# Patient Record
Sex: Male | Born: 1990 | Race: White | Hispanic: No | Marital: Single | Smoking: Never smoker
Health system: Southern US, Community
[De-identification: ages and names within clinical notes are randomized; demographics above are authoritative.]

## PROBLEM LIST (undated history)

## (undated) HISTORY — PX: ABDOMINAL SURGERY: SHX537

## (undated) HISTORY — PX: APPENDECTOMY: SHX54

---

## 2014-01-30 ENCOUNTER — Other Ambulatory Visit: Payer: Self-pay | Admitting: Family Medicine

## 2014-01-30 DIAGNOSIS — R1032 Left lower quadrant pain: Secondary | ICD-10-CM

## 2014-01-30 DIAGNOSIS — K469 Unspecified abdominal hernia without obstruction or gangrene: Secondary | ICD-10-CM

## 2014-02-02 ENCOUNTER — Ambulatory Visit
Admission: RE | Admit: 2014-02-02 | Discharge: 2014-02-02 | Disposition: A | Discharge status: Home / Self Care | Payer: BC Managed Care – PPO | Source: Ambulatory Visit | Attending: Family Medicine | Admitting: Family Medicine

## 2014-02-02 DIAGNOSIS — R1032 Left lower quadrant pain: Secondary | ICD-10-CM

## 2014-02-02 DIAGNOSIS — K469 Unspecified abdominal hernia without obstruction or gangrene: Secondary | ICD-10-CM

## 2014-02-02 MED ORDER — METHYLPREDNISOLONE ACETATE 40 MG/ML INJ SUSP (RADIOLOG
120.0000 mg | Freq: Once | INTRAMUSCULAR | Status: AC
  Administered 2014-02-02: 80 mg via INTRA_ARTICULAR

## 2014-02-02 MED ORDER — IOHEXOL 180 MG/ML  SOLN: 1.0000 mL | Freq: Once | INTRAMUSCULAR | Status: AC | PRN

## 2015-09-19 ENCOUNTER — Emergency Department (HOSPITAL_COMMUNITY)
Admission: EM | Admit: 2015-09-19 | Discharge: 2015-09-20 | Disposition: A | Discharge status: Home / Self Care | Payer: Managed Care, Other (non HMO) | Attending: Emergency Medicine | Admitting: Emergency Medicine

## 2015-09-19 ENCOUNTER — Emergency Department (HOSPITAL_COMMUNITY): Payer: Managed Care, Other (non HMO)

## 2015-09-19 ENCOUNTER — Encounter (HOSPITAL_COMMUNITY): Payer: Self-pay | Admitting: Emergency Medicine

## 2015-09-19 DIAGNOSIS — Y9366 Activity, soccer: Secondary | ICD-10-CM | POA: Insufficient documentation

## 2015-09-19 DIAGNOSIS — S022XXA Fracture of nasal bones, initial encounter for closed fracture: Secondary | ICD-10-CM | POA: Insufficient documentation

## 2015-09-19 DIAGNOSIS — S0121XA Laceration without foreign body of nose, initial encounter: Secondary | ICD-10-CM | POA: Diagnosis not present

## 2015-09-19 DIAGNOSIS — IMO0002 Reserved for concepts with insufficient information to code with codable children: Secondary | ICD-10-CM

## 2015-09-19 DIAGNOSIS — Y999 Unspecified external cause status: Secondary | ICD-10-CM | POA: Diagnosis not present

## 2015-09-19 DIAGNOSIS — S0992XA Unspecified injury of nose, initial encounter: Secondary | ICD-10-CM | POA: Diagnosis present

## 2015-09-19 DIAGNOSIS — W228XXA Striking against or struck by other objects, initial encounter: Secondary | ICD-10-CM | POA: Insufficient documentation

## 2015-09-19 DIAGNOSIS — Y92322 Soccer field as the place of occurrence of the external cause: Secondary | ICD-10-CM | POA: Insufficient documentation

## 2015-09-19 MED ORDER — LIDOCAINE HCL 2 % IJ SOLN
20.0000 mL | Freq: Once | INTRAMUSCULAR | Status: AC
  Administered 2015-09-19: 400 mg
  Filled 2015-09-19: qty 20

## 2015-09-19 NOTE — ED Notes (Signed)
Pt. presents with laceration at bridge of nose approx.1" sustained this evening accidentally hit while playing soccer . Denies LOC/no epistaxis .

## 2015-09-19 NOTE — ED Provider Notes (Signed)
CSN: [id number]     Arrival date & time 09/19/15  2045 History  By signing my name below, I, Phillis HaggisGabriella Gaje, attest that this documentation has been prepared under the direction and in the presence of Newell RubbermaidJeffrey Youssef Footman, PA-C. Electronically Signed: Phillis HaggisGabriella Gaje, ED Scribe. 09/19/2015. 11:31 PM.   Chief Complaint  Patient presents with  . Laceration   The history is provided by the patient. No language interpreter was used.  HPI Comments: Steven ReamsJusten Lester is a 25 y.o. male who presents to the Emergency Department complaining of a laceration to the bridge of the nose onset 3 hours ago. Pt reports sustaining the laceration while playing soccer. Pt is not utd on tdap. He denies visual disturbances, nausea, vomiting, LOC or epistaxis.  History reviewed. No pertinent past medical history. Past Surgical History  Procedure Laterality Date  . Appendectomy    . Abdominal surgery     No family history on file. Social History  Substance Use Topics  . Smoking status: Never Smoker   . Smokeless tobacco: None  . Alcohol Use: Yes    Review of Systems  All other systems reviewed and are negative.  Allergies  Review of patient's allergies indicates no known allergies.  Home Medications   Prior to Admission medications   Medication Sig Start Date End Date Taking? Authorizing Provider  cephALEXin (KEFLEX) 500 MG capsule Take 1 capsule (500 mg total) by mouth 2 (two) times daily. 09/20/15   Mujahid Jalomo, PA-C   BP 120/97 mmHg  Pulse 86  Temp(Src) 98.4 F (36.9 C) (Oral)  Resp 16  Ht 5\' 8"  (1.727 m)  Wt 63.05 kg  BMI 21.14 kg/m2  SpO2 99% Physical Exam  Constitutional: He is oriented to person, place, and time. He appears well-developed and well-nourished.  HENT:  Head: Normocephalic and atraumatic.  Mouth/Throat: Oropharynx is clear and moist.  1 cm laceration to the bridge of the nose. No septal hematoma, septal deviation  Eyes: Conjunctivae and EOM are normal. Pupils are equal, round,  and reactive to light.  Neck: Normal range of motion. Neck supple.  Musculoskeletal: Normal range of motion.  Neurological: He is alert and oriented to person, place, and time. No cranial nerve deficit.  Skin: Skin is warm and dry.  Psychiatric: He has a normal mood and affect. His behavior is normal.  Nursing note and vitals reviewed.   ED Course  Procedures (including critical care time) DIAGNOSTIC STUDIES: Oxygen Saturation is 99% on RA, normal by my interpretation.    COORDINATION OF CARE: 11:04 PM-Discussed treatment plan which includes x-ray and laceration repair with pt at bedside and pt agreed to plan.   LACERATION REPAIR Performed by: Eyvonne MechanicJeffrey Masyn Fullam, PA-C. Consent: Verbal consent obtained. Risks and benefits: risks, benefits and alternatives were discussed Patient identity confirmed: provided demographic data Time out performed prior to procedure Prepped and Draped in normal sterile fashion Wound explored Laceration Location: bridge of nose Laceration Length: 1 cm No Foreign Bodies seen or palpated Anesthesia: local infiltration Local anesthetic: lidocaine 2% w/o epinephrine Anesthetic total: 1 ml Irrigation method: syringe Amount of cleaning: standard Skin closure: 6-0 prolene Number of sutures or staples: 4 Technique: simple interrupted  Patient tolerance: Patient tolerated the procedure well with no immediate complications.    Labs Review Labs Reviewed - No data to display  Imaging Review Dg Nasal Bones  09/19/2015  CLINICAL DATA:  Soccer injury with nasal laceration. Initial encounter. EXAM: NASAL BONES - 3+ VIEW COMPARISON:  None. FINDINGS: Mildly depressed bilateral  nasal arch fracture. There is an overlying laceration with bandage. The septum is mildly deviated to the right of indeterminate chronicity. No evidence of orbital ethmoid continuation. Clear paranasal sinuses. IMPRESSION: Mildly depressed nasal arch fracture. Electronically Signed   By: Marnee Spring M.D.   On: 09/19/2015 21:20   I have personally reviewed and evaluated these images and lab results as part of my medical decision-making.   EKG Interpretation None      MDM   Final diagnoses:  Laceration  Nasal fracture, closed, initial encounter  Labs:  Imaging: DG Nasal bones: Mildly depressed nasal arch fracture  Consults:  Therapeutics: Laceration repair  Discharge Meds: Keflex  Assessment/Plan: Tdap booster given.Pressure irrigation performed. Laceration occurred < 8 hours prior to repair which was well tolerated. Pt has no co morbidities to effect normal wound healing. Discussed suture home care w pt and answered questions. Pt to f-u for wound check and suture removal in 7 days. Pt is hemodynamically stable w no complaints prior to dc.    I personally performed the services described in this documentation, which was scribed in my presence. The recorded information has been reviewed and is accurate.    Eyvonne Mechanic, PA-C 09/20/15 [id number]  Rolan Bucco, MD 09/20/15 1004

## 2015-09-20 MED ORDER — CEPHALEXIN 500 MG PO CAPS: 500.0000 mg | ORAL_CAPSULE | Freq: Two times a day (BID) | ORAL | Status: AC

## 2015-09-20 MED ORDER — TETANUS-DIPHTH-ACELL PERTUSSIS 5-2.5-18.5 LF-MCG/0.5 IM SUSP
0.5000 mL | Freq: Once | INTRAMUSCULAR | Status: AC
  Administered 2015-09-20: 0.5 mL via INTRAMUSCULAR
  Filled 2015-09-20: qty 0.5

## 2015-09-20 MED ORDER — CEPHALEXIN 250 MG PO CAPS
500.0000 mg | ORAL_CAPSULE | Freq: Once | ORAL | Status: AC
  Administered 2015-09-20: 500 mg via ORAL
  Filled 2015-09-20: qty 2

## 2015-09-20 NOTE — ED Notes (Signed)
Pt departed in NAD.  

## 2015-09-20 NOTE — Discharge Instructions (Signed)
Please keep wound clean, avoid sun exposure, have sutures removed in 5-7 days. If scabbing or buildup of tissue starts to develop please use Q-tip soaked in peroxide to clean the outside of the wound. Please return immediately if any signs of infection present.  Nasal Fracture A nasal fracture is a break or crack in the bones or cartilage of the nose. Minor breaks do not require treatment. These breaks usually heal on their own after about one month. Serious breaks may require surgery. CAUSES This injury is usually caused by a blunt injury to the nose. This type of injury often occurs from:  Contact sports.  Car accidents.  Falls.  Getting punched. SYMPTOMS Symptoms of this injury include:  Pain.  Swelling of the nose.  Bleeding from the nose.  Bruising around the nose or eyes. This may include having black eyes.  Crooked appearance of the nose. DIAGNOSIS This injury may be diagnosed with a physical exam. The health care provider will gently feel the nose for signs of broken bones. He or she will look inside the nostrils to make sure that there is not a blood-filled swelling on the dividing wall between the nostrils (septal hematoma). X-rays of the nose may not show a nasal fracture even when one is present. In some cases, X-rays or a CT scan may be done 1-5 days after the injury. Sometimes, the health care provider will want to wait until the swelling has gone down. TREATMENT Often, minor fractures that have caused no deformity do not require treatment. More serious fractures in which bones have moved out of position may require surgery, which will take place after the swelling is gone. Surgery will stabilize and align the fracture. In some cases, a health care provider may be able to reposition the bones without surgery. This may be done in the health care provider's office after medicine is given to numb the area (local anesthetic). HOME CARE INSTRUCTIONS  If directed, apply ice to  the injured area:  Put ice in a plastic bag.  Place a towel between your skin and the bag.  Leave the ice on for 20 minutes, 2-3 times per day.  Take over-the-counter and prescription medicines only as told by your health care provider.  If your nose starts to bleed, sit in an upright position while you squeeze the soft parts of your nose against the dividing wall between your nostrils (septum) for 10 minutes.  Try to avoid blowing your nose.  Return to your normal activities as told by your health care provider. Ask your health care provider what activities are safe for you.  Avoid contact sports for 3-4 weeks or as told by your health care provider.  Keep all follow-up visits as told by your health care provider. This is important. SEEK MEDICAL CARE IF:  Your pain increases or becomes severe.  You continue to have nosebleeds.  The shape of your nose does not return to normal within 5 days.  You have pus draining out of your nose. SEEK IMMEDIATE MEDICAL CARE IF:  You have bleeding from your nose that does not stop after you pinch your nostrils closed for 20 minutes and keep ice on your nose.  You have clear fluid draining out of your nose.  You notice a grape-like swelling on the septum. This swelling is a collection of blood (hematoma) that must be drained to help prevent infection.  You have difficulty moving your eyes.  You have repeated vomiting.   This information  is not intended to replace advice given to you by your health care provider. Make sure you discuss any questions you have with your health care provider.   Document Released: 02/14/2000 Document Revised: 11/07/2014 Document Reviewed: 03/26/2014 Elsevier Interactive Patient Education [employer]2016 [employer]

## 2016-04-25 IMAGING — RF DG FLUORO GUIDE NDL PLC/BX
1 series · 1 of 1 positions shown · non-contrast
Comparison: MRI 01/15/2014.

CLINICAL DATA: LEFT lower quadrant pain. Abdominal hernia. Osteitis
pubis. Sports hernia.

EXAM:
FLUORO GUIDED NEEDLE PLACEMENT
FLUOROSCOPY TIME:  0 min 26 seconds
Dose: 0.0686 4ycmK

[Series 1: dg fluoro guide ndl plc/bx · 1 of 1 slices shown]
[im 1/1]
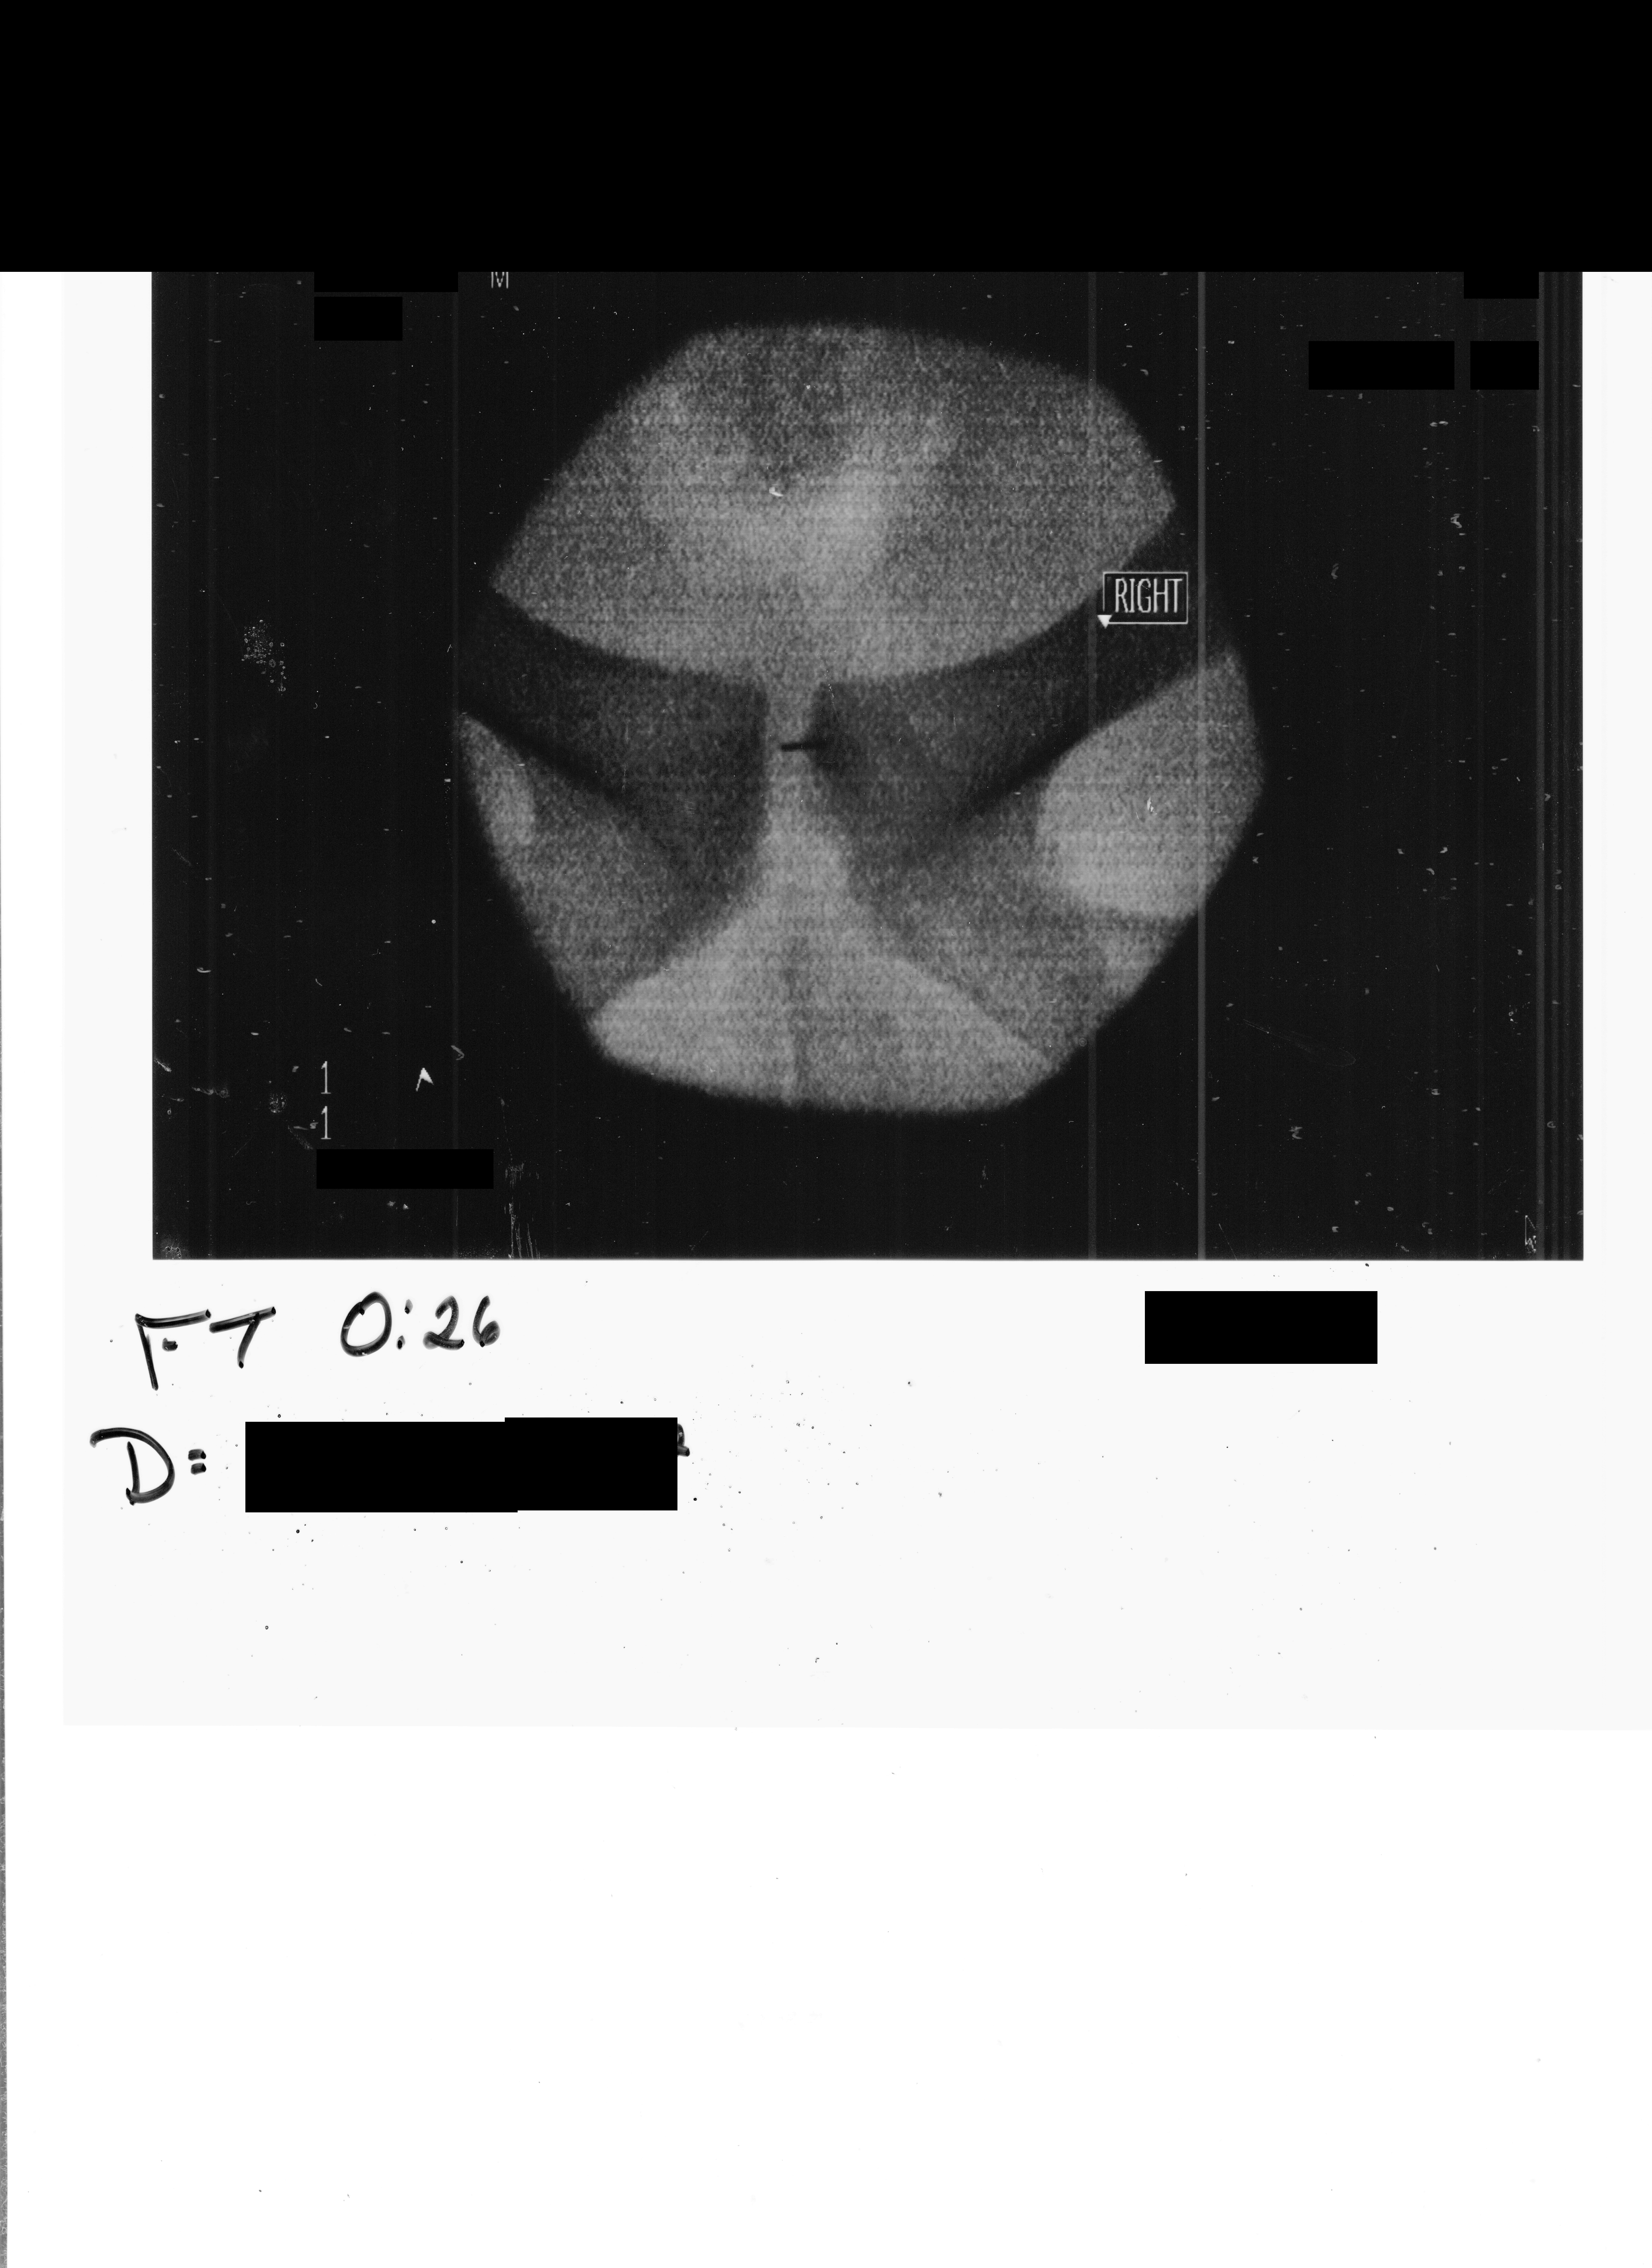

[1 of 1 positions shown; findings below may reference images not displayed]

PROCEDURE:
Written and verbal informed consent was obtained after a thorough
discussion of risks and benefits of the procedure, including the
general risks of bleeding, infection, injury to nerves and blood
vessels, and allergic reaction. Specific risks included non target
injection, nontherapeutic/nondiagnostic injection. Verbal consent
was obtained by Dr. Keiser.

The lower anterior pelvis was prepped and draped in the usual
sterile fashion. Under fluoroscopic guidance using the anterior
approach, a 27 gauge hypodermic needle was inserted into the pubic
symphysis after local anesthesia with 1% lidocaine without
epinephrine. Subsequently 80 mg of Depo-Medrol was injected followed
by 2 ml of 0.5 % Sensorcaine. Needles were removed and a sterile
dressing was applied.

The patient tolerated the procedure well and there were no
complications.
IMPRESSION: Technically successful pubic symphysis steroid and anesthetic
injection.

## 2017-12-10 IMAGING — CR DG NASAL BONES 3+V
3 series · 3 of 3 positions shown · non-contrast
Comparison: None.

CLINICAL DATA: Soccer injury with nasal laceration. Initial
encounter.

EXAM:
NASAL BONES - 3+ VIEW

[nasal waters]
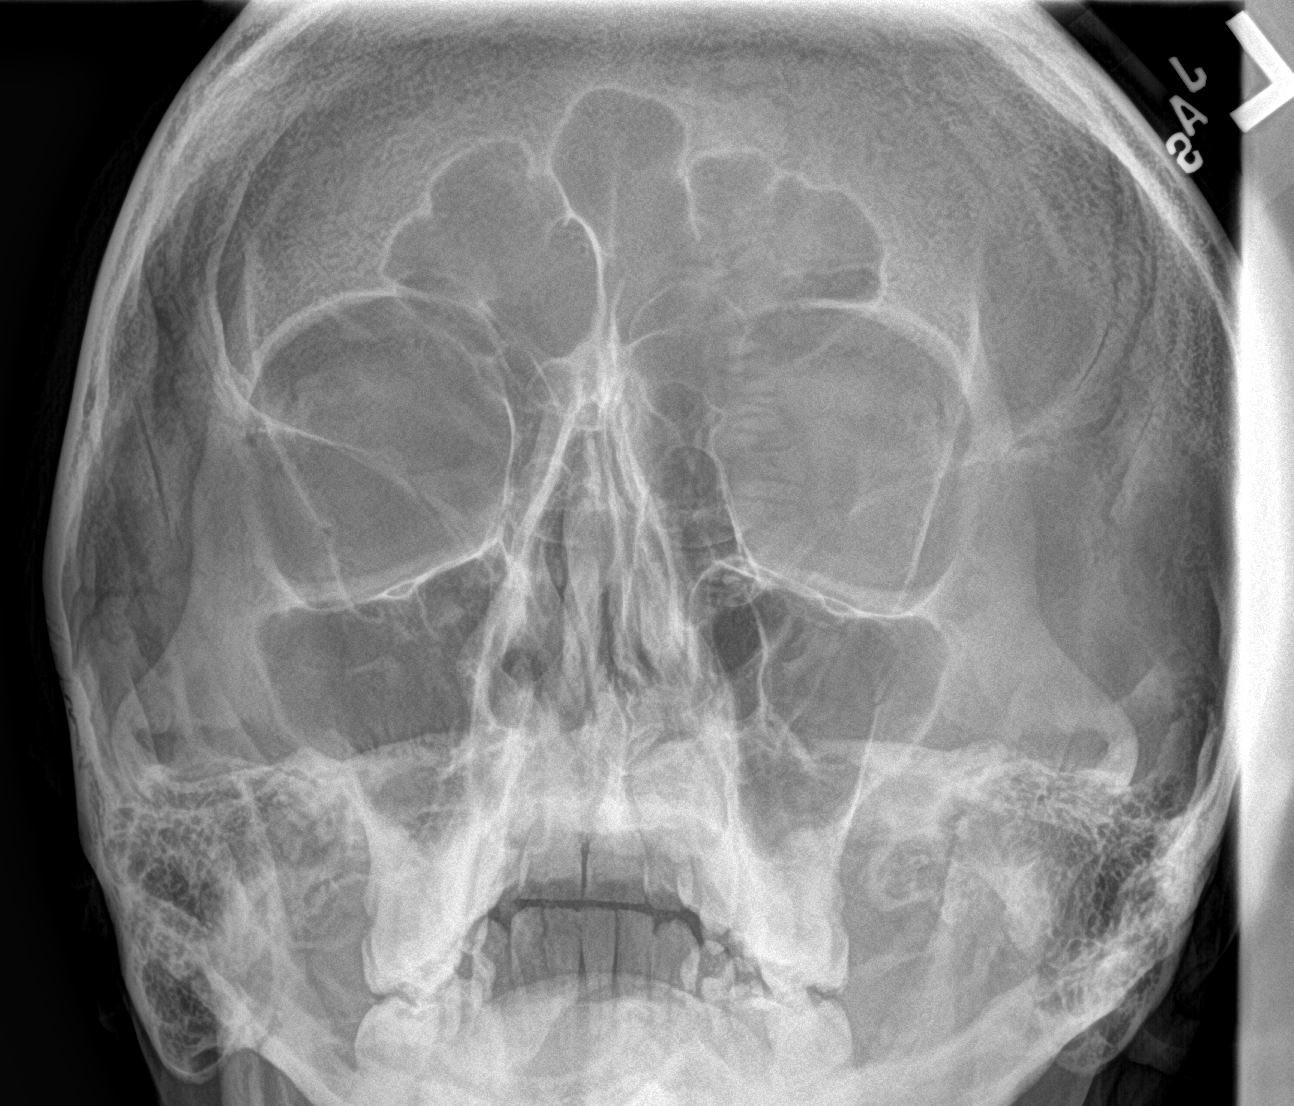

[nasal lat (1 of 2)]
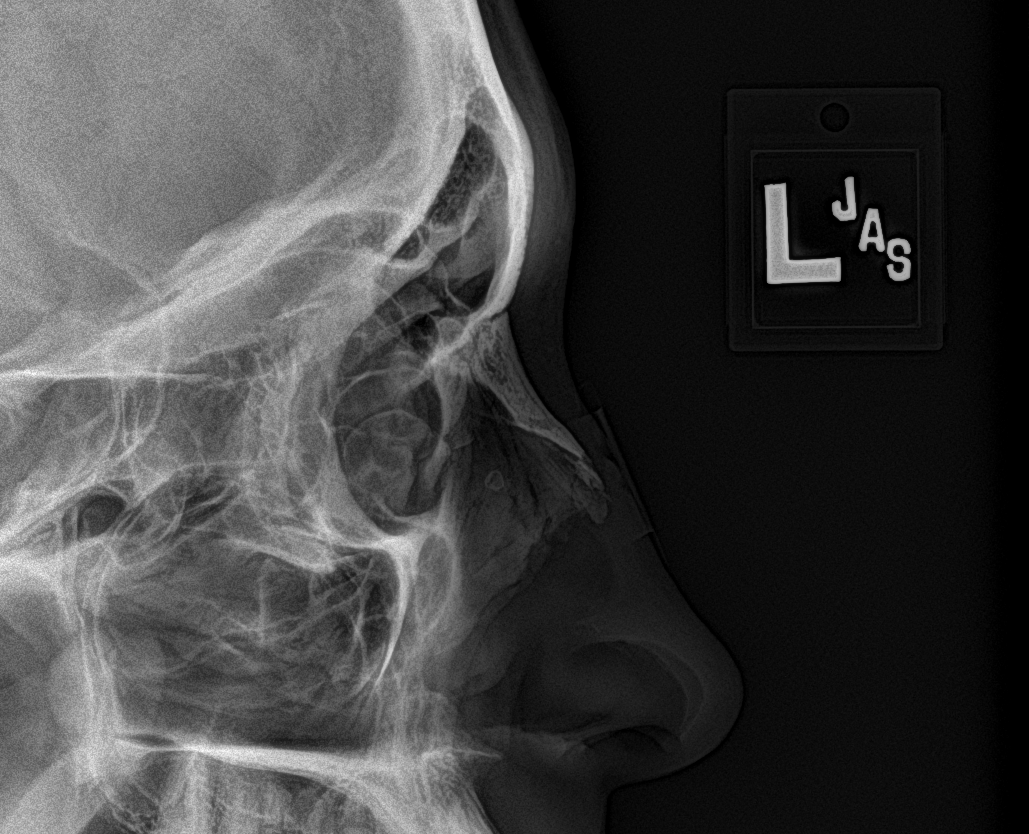

[nasal lat (2 of 2)]
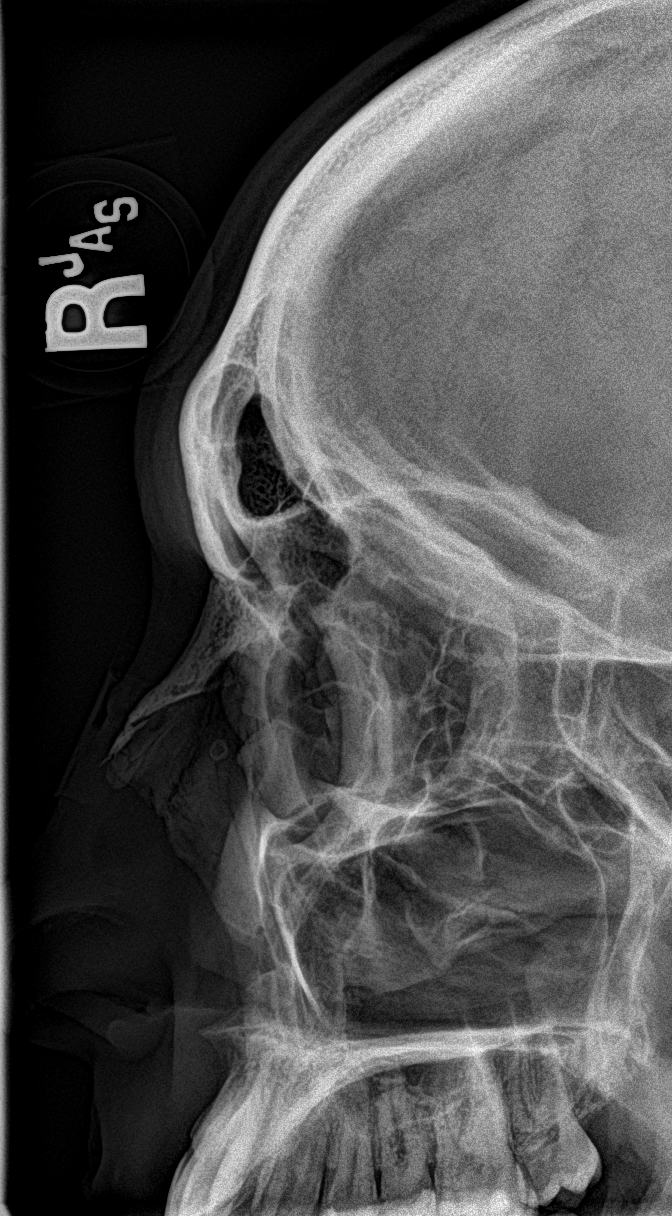

[3 of 3 positions shown; findings below may reference images not displayed]

FINDINGS: Mildly depressed bilateral nasal arch fracture. There is an
overlying laceration with bandage. The septum is mildly deviated to
the right of indeterminate chronicity. No evidence of orbital
ethmoid continuation. Clear paranasal sinuses.
IMPRESSION: Mildly depressed nasal arch fracture.
